# Patient Record
Sex: Male | Born: 1970 | Race: White | Hispanic: Yes | Marital: Married | Smoking: Never smoker
Health system: Southern US, Community
[De-identification: ages and names within clinical notes are randomized; demographics above are authoritative.]

---

## 2018-01-24 ENCOUNTER — Emergency Department (HOSPITAL_BASED_OUTPATIENT_CLINIC_OR_DEPARTMENT_OTHER): Payer: Self-pay

## 2018-01-24 ENCOUNTER — Other Ambulatory Visit: Payer: Self-pay

## 2018-01-24 ENCOUNTER — Emergency Department (HOSPITAL_BASED_OUTPATIENT_CLINIC_OR_DEPARTMENT_OTHER)
Admission: EM | Admit: 2018-01-24 | Discharge: 2018-01-24 | Disposition: A | Discharge status: Home / Self Care | Payer: Self-pay | Attending: Emergency Medicine | Admitting: Emergency Medicine

## 2018-01-24 ENCOUNTER — Encounter (HOSPITAL_BASED_OUTPATIENT_CLINIC_OR_DEPARTMENT_OTHER): Payer: Self-pay | Admitting: Adult Health

## 2018-01-24 DIAGNOSIS — Y9389 Activity, other specified: Secondary | ICD-10-CM | POA: Insufficient documentation

## 2018-01-24 DIAGNOSIS — Y999 Unspecified external cause status: Secondary | ICD-10-CM | POA: Insufficient documentation

## 2018-01-24 DIAGNOSIS — Y929 Unspecified place or not applicable: Secondary | ICD-10-CM | POA: Insufficient documentation

## 2018-01-24 DIAGNOSIS — S62635B Displaced fracture of distal phalanx of left ring finger, initial encounter for open fracture: Secondary | ICD-10-CM

## 2018-01-24 DIAGNOSIS — W278XXA Contact with other nonpowered hand tool, initial encounter: Secondary | ICD-10-CM | POA: Insufficient documentation

## 2018-01-24 MED ORDER — LIDOCAINE HCL (PF) 1 % IJ SOLN
30.0000 mL | Freq: Once | INTRAMUSCULAR | Status: AC
  Administered 2018-01-24: 30 mL

## 2018-01-24 MED ORDER — BUPIVACAINE HCL (PF) 0.5 % IJ SOLN
5.0000 mL | Freq: Once | INTRAMUSCULAR | Status: AC
  Administered 2018-01-24: 5 mL
  Filled 2018-01-24: qty 10

## 2018-01-24 MED ORDER — LIDOCAINE HCL (PF) 1 % IJ SOLN
5.0000 mL | Freq: Once | INTRAMUSCULAR | Status: AC
  Administered 2018-01-24: 5 mL
  Filled 2018-01-24: qty 5

## 2018-01-24 MED ORDER — CEFAZOLIN SODIUM-DEXTROSE 1-4 GM/50ML-% IV SOLN
INTRAVENOUS | Status: AC
  Filled 2018-01-24: qty 100

## 2018-01-24 MED ORDER — CEFAZOLIN SODIUM-DEXTROSE 2-4 GM/100ML-% IV SOLN
2.0000 g | Freq: Once | INTRAVENOUS | Status: AC
  Administered 2018-01-24: 2 g via INTRAVENOUS
  Filled 2018-01-24: qty 100

## 2018-01-24 MED ORDER — FENTANYL CITRATE (PF) 100 MCG/2ML IJ SOLN
50.0000 ug | Freq: Once | INTRAMUSCULAR | Status: AC
  Administered 2018-01-24: 50 ug via INTRAVENOUS
  Filled 2018-01-24: qty 2

## 2018-01-24 NOTE — ED Triage Notes (Addendum)
PResents with injury to left  ring finger. Pt smashed it with a hammer while doing wood work. Tip of finger is maimed and has nail and nail bed involvement

## 2018-01-24 NOTE — ED Provider Notes (Addendum)
[facility] EMERGENCY DEPARTMENT Provider Note   CSN: [id number] Arrival date & time: 01/24/18  1127    History   Chief Complaint Chief Complaint  Patient presents with  . Finger Injury    HPI Robert Day is a 47 y.o. male without significant past medical hx who presents to the ED with L 4th finger injury which occurred 1 hour PTA.  Patient states that he accidentally smashed the distal left fourth finger with a hammer resulting in an open wound.  He states he is having pain to this location which is a 9 out of 10 in severity, no specific alleviating or aggravating factors.  No intervention prior to arrival.  No other areas of injury noted.  Patient is right-hand dominant.  His last tetanus was within the past 5 years.  Denies numbness, tingling, or weakness. Last PO intake was last evening.   HPI  History reviewed. No pertinent past medical history.  There are no active problems to display for this patient.   History reviewed. No pertinent surgical history.      Home Medications    Prior to Admission medications   Not on File    Family History History reviewed. No pertinent family history.  Social History Social History   Tobacco Use  . Smoking status: Never Smoker  Substance Use Topics  . Alcohol use: Never    Frequency: Never  . Drug use: Never     Allergies   Patient has no known allergies.   Review of Systems Review of Systems  Constitutional: Negative for chills and fever.  Musculoskeletal: Positive for arthralgias.  Skin: Positive for wound.  Neurological: Negative for weakness and numbness.  All other systems reviewed and are negative.    Physical Exam Updated Vital Signs BP (!) 174/112 (BP Location: Right Arm) Comment: checked twice  Pulse 76   Temp 98.3 F (36.8 C) (Oral)   Resp 18   Ht 5' 6.5" (1.689 m)   Wt 81.6 kg   SpO2 99%   BMI 28.62 kg/m   Physical Exam  Constitutional: He appears well-developed and  well-nourished. No distress.  HENT:  Head: Normocephalic and atraumatic.  Eyes: Conjunctivae are normal. Right eye exhibits no discharge. Left eye exhibits no discharge.  Cardiovascular:  2+ symmetric radial pulses.  Musculoskeletal:  Upper extremities: With complex laceration to the distal left fourth digit which occurs both ventrally and dorsally, appears to likely extend somewhat into the nailbed.  PIctured below.  Patient has full active range of motion of joints of the upper extremities including all MCPs and IP joints.  Tender to palpation over the left DIP and distal phalanx.  No other areas of tenderness to the upper extremities.  No snuffbox tenderness.  Neurological: He is alert.  Clear speech. Sensation grossly intact to bilateral upper extremities. 5/5 symmetric grip strength.   Psychiatric: He has a normal mood and affect. His behavior is normal. Thought content normal.  Nursing note and vitals reviewed.            ED Treatments / Results  Labs (all labs ordered are listed, but only abnormal results are displayed) Labs Reviewed - No data to display  EKG None  Radiology Dg Finger Ring Left  Result Date: 01/24/2018 CLINICAL DATA:  Hit finger with hammer. EXAM: LEFT RING FINGER 2+V COMPARISON:  None. FINDINGS: There is a fracture through the tip of the left ring finger distal phalanx. Fracture fragments are mildly displaced. Overlying soft tissue defect  and swelling. No subluxation or dislocation. IMPRESSION: Displaced fracture fragments through the tip of the left ring finger distal phalanx. Electronically Signed   By: Charlett Nose M.D.   On: 01/24/2018 11:59    Procedures .Nerve Block Date/Time: 01/24/2018 1:47 PM Performed by: Cherly Anderson, PA-C Authorized by: Cherly Anderson, PA-C   Consent:    Consent obtained:  Verbal   Consent given by:  Patient   Risks discussed:  Allergic reaction, bleeding, infection, intravenous injection, nerve  damage, pain and unsuccessful block   Alternatives discussed:  No treatment Indications:    Indications:  Pain relief and procedural anesthesia Location:    Body area:  Upper extremity   Laterality:  Left (4th finger digital block) Pre-procedure details:    Skin preparation:  Alcohol Procedure details (see MAR for exact dosages):    Block needle gauge:  27 G   Anesthetic injected:  Lidocaine 1% w/o epi and bupivacaine 0.5% w/o epi   Injection procedure:  Anatomic landmarks identified Post-procedure details:    Outcome:  Anesthesia achieved   Patient tolerance of procedure:  Tolerated well, no immediate complications   (including critical care time)  Medications Ordered in ED Medications  ceFAZolin (ANCEF) 1-4 GM/50ML-% IVPB (has no administration in time range)  bupivacaine (MARCAINE) 0.5 % injection 5 mL (5 mLs Infiltration Given by Other 01/24/18 1238)  lidocaine (PF) (XYLOCAINE) 1 % injection 5 mL (5 mLs Infiltration Given by Other 01/24/18 1238)  ceFAZolin (ANCEF) IVPB 2g/100 mL premix ( Intravenous Stopped 01/24/18 1328)  fentaNYL (SUBLIMAZE) injection 50 mcg (50 mcg Intravenous Given 01/24/18 1246)     Initial Impression / Assessment and Plan / ED Course  I have reviewed the triage vital signs and the nursing notes.  Pertinent labs & imaging results that were available during my care of the patient were reviewed by me and considered in my medical decision making (see chart for details).   Patient presents to the emergency department with left fourth finger injury.  Patient appears to have open fracture with displaced fracture fragments of the tip of the distal phalanx as well as fairly complex laceration as pictured above.  Last tetanus was within past 5 years. Given 2 g of Ancef IV as well as fentanyl for pain. Digital block performed with subsequent pressure irrigation with 1L of sterile water. Will plan for hand surgery consultation.   14:00: CONSULT: Discussed case with Dr.  Carlos Levering team- will transfer to [facility] ER via POV for his team to evaluate. Patient's wife is able to take him there.   14:08: CONSULT: Discussed case with ED physician Dr. Jacqulyn Bath- accepts patient for ER to ER transfer.   I discussed results and plan for transfer with patient. Provided opportunity for questions, patient confirmed understanding and is in agreement with plan.   Findings and plan of care discussed with supervising physician Dr. Fredderick Phenix- in agreement.    Final Clinical Impressions(s) / ED Diagnoses   Final diagnoses:  Displaced fracture of distal phalanx of left ring finger, initial encounter for open fracture    ED Discharge Orders    None       Cherly Anderson, PA-C 01/24/18 1540    [id number] [place], PA-C 01/24/18 1542    Rolan Bucco, MD 01/25/18 [phone]

## 2018-01-24 NOTE — Consult Note (Signed)
Reason for Consult:for evaluation treatment of his left ring finger after crushing injury with open fracture. He was transferred from [facility]. Referring Physician: [facility] emergency room staff  Robert Day is an 47 y.o. male.  HPI: 47 year oldmale crush injury left ring finger with open fracture. He has nail plate injury nailbed and disarray the soft tissue.  He denies other injury. He is here for surgical management. He denies neck back chest or abdominal pain.  History reviewed. No pertinent past medical history.  History reviewed. No pertinent surgical history.  History reviewed. No pertinent family history.  Social History:  reports that he has never smoked. He does not have any smokeless tobacco history on file. He reports that he does not drink alcohol or use drugs.  Allergies: No Known Allergies  Medications: I have reviewed the patient's current medications.  No results found for this or any previous visit (from the past 48 hour(s)).  Dg Finger Ring Left  Result Date: 01/24/2018 CLINICAL DATA:  Hit finger with hammer. EXAM: LEFT RING FINGER 2+V COMPARISON:  None. FINDINGS: There is a fracture through the tip of the left ring finger distal phalanx. Fracture fragments are mildly displaced. Overlying soft tissue defect and swelling. No subluxation or dislocation. IMPRESSION: Displaced fracture fragments through the tip of the left ring finger distal phalanx. Electronically Signed   By: Charlett Nose M.D.   On: 01/24/2018 11:59    Review of Systems  HENT: Negative.   Respiratory: Negative.   Cardiovascular: Negative.   Gastrointestinal: Negative.   Genitourinary: Negative.   Neurological: Negative.   Endo/Heme/Allergies: Negative.   Psychiatric/Behavioral: Negative.    Blood pressure (!) 148/103, pulse 65, temperature 98.4 F (36.9 C), temperature source Oral, resp. rate 17, height 5' 6.5" (1.689 m), weight 81.6 kg, SpO2 99 %. Physical  Exam Left ring finger crush injury open fracture nail bed nail plate disarray. No evidence of instability or infection no evidence of asked her compromise.  X-rays do reveal the distal phalanx fracture comminuted in nature. There is large amount of skin avulsion.  The patient is alert and oriented in no acute distress. The patient complains of pain in the affected upper extremity.  The patient is noted to have a normal HEENT exam. Lung fields show equal chest expansion and no shortness of breath. Abdomen exam is nontender without distention. Lower extremity examination does not show any fracture dislocation or blood clot symptoms. Pelvis is stable and the neck and back are stable and nontender. Assessment/Plan: Left ring finger crushing injury. I've consented him for I and D and repair is necessary. In stance risk and benefits and desires to proceed.   01/24/2018  4:07 PM  PATIENT:  Robert Day    PRE-OPERATIVE DIAGNOSIS:    POST-OPERATIVE DIAGNOSIS:  Same  PROCEDURE:    SURGEON:  Karen Chafe, MD  PHYSICIAN ASSISTANT:   ANESTHESIA:     PREOPERATIVE INDICATIONS:  Robert Day is a  47 y.o. male with a diagnosis of   The risks benefits and alternatives were discussed with the patient preoperatively including but not limited to the risks of infection, bleeding, nerve injury, cardiopulmonary complications, the need for revision surgery, among others, and the patient was willing to proceed.   OPERATIVE PROCEDURE: Patient was seen sterilely prepped and draped in the sterile fashion after a intermetacarpal block was performed with lidocaine without epinephrine. Patient then had nail plate removed with Camelia Eng. Following this the  patient went irrigation debridement and open fracture about the distal phalanx. This was an excisional debridement of a open fracture performed with curette knife blade and scissor. Following this patient underwent setting  technique of the distal phalanx with open treatment of distal phalanx fracture. Following this the patient underwent meticulous repair of the nail bed with 4-0 loupe magnification and 6-0 chromic suture for the complex repair. Next the lateral nail fold was repaired. Following this Adaptic was placed under the eponychial fold to prevent nailbed adherent and the patient was dressed sterilely.  Thus the patient underwent irrigation and debridement of an open fracture, nail plate removal, nailbed repair, open treatment of distal phalanx fracture.  Wounds look good conclusion of the procedure. There were no comp cutting features. Adaptic under the eponychial fold. I'll see him Thursday for dressing change. Discharge medicines Bactrim DS 14 days and Norco when necessary pain with precautions.   We recommend that you to take vitamin C 1000 mg a day to promote healing. We also recommend that if you require  pain medicine that you take a stool softener to prevent constipation as most pain medicines will have constipation side effects. We recommend either Peri-Colace or Senokot and recommend that you also consider adding MiraLAX as well to prevent the constipation affects from pain medicine if you are required to use them. These medicines are over the counter and may be purchased at a local pharmacy. A cup of yogurt and a probiotic can also be helpful during the recovery process as the medicines can disrupt your intestinal environment. Keep bandage clean and dry.  Call for any problems.  No smoking.  Criteria for driving a car: you should be off your pain medicine for 7-8 hours, able to drive one handed(confident), thinking clearly and feeling able in your judgement to drive. Continue elevation as it will decrease swelling.  If instructed by MD move your fingers within the confines of the bandage/splint.  Use ice if instructed by your MD. Call immediately for any sudden loss of feeling in your hand/arm or change  in functional abilities of the extremity. Robert Day Robert Day 01/24/2018, 4:05 PM

## 2018-01-24 NOTE — Discharge Instructions (Addendum)
Please see S Thursday as Dr. Amanda Pea discussed with the period  We recommend that you to take vitamin C 1000 mg a day to promote healing. We also recommend that if you require  pain medicine that you take a stool softener to prevent constipation as most pain medicines will have constipation side effects. We recommend either Peri-Colace or Senokot and recommend that you also consider adding MiraLAX as well to prevent the constipation affects from pain medicine if you are required to use them. These medicines are over the counter and may be purchased at a local pharmacy. A cup of yogurt and a probiotic can also be helpful during the recovery process as the medicines can disrupt your intestinal environment.   Keep bandage clean and dry.  Call for any problems.  No smoking.  Criteria for driving a car: you should be off your pain medicine for 7-8 hours, able to drive one handed(confident), thinking clearly and feeling able in your judgement to drive. Continue elevation as it will decrease swelling.  If instructed by MD move your fingers within the confines of the bandage/splint.  Use ice if instructed by your MD. Call immediately for any sudden loss of feeling in your hand/arm or change in functional abilities of the extremity.

## 2018-01-24 NOTE — ED Notes (Signed)
Pt verbalized understanding that he is not eat, drink or use IV for any purpose while en route to [facility] ED.

## 2018-01-24 NOTE — ED Triage Notes (Signed)
Per EDP Zavitz have Pt follow up with PCP. Give Pt the referral # to find a PCP

## 2018-01-24 NOTE — ED Triage Notes (Signed)
Pt arrived with IV intact to right arm from [facility].  Pt has a wrapped left hand with ring finger injury.  Pain free and Dr. Amanda Pea has seen patient.

## 2018-01-24 NOTE — ED Notes (Signed)
Report to Asher Muir, charge RN [facility] ED

## 2018-01-24 NOTE — ED Notes (Signed)
ED Provider at bedside. 

## 2018-01-24 NOTE — ED Notes (Signed)
Patient transported to X-ray 

## 2020-05-20 IMAGING — CR DG FINGER RING 2+V*L*
3 series · 3 of 3 positions shown · non-contrast
Comparison: None.

CLINICAL DATA: Hit finger with hammer.

EXAM:
LEFT RING FINGER 2+V

[x finger pa left]
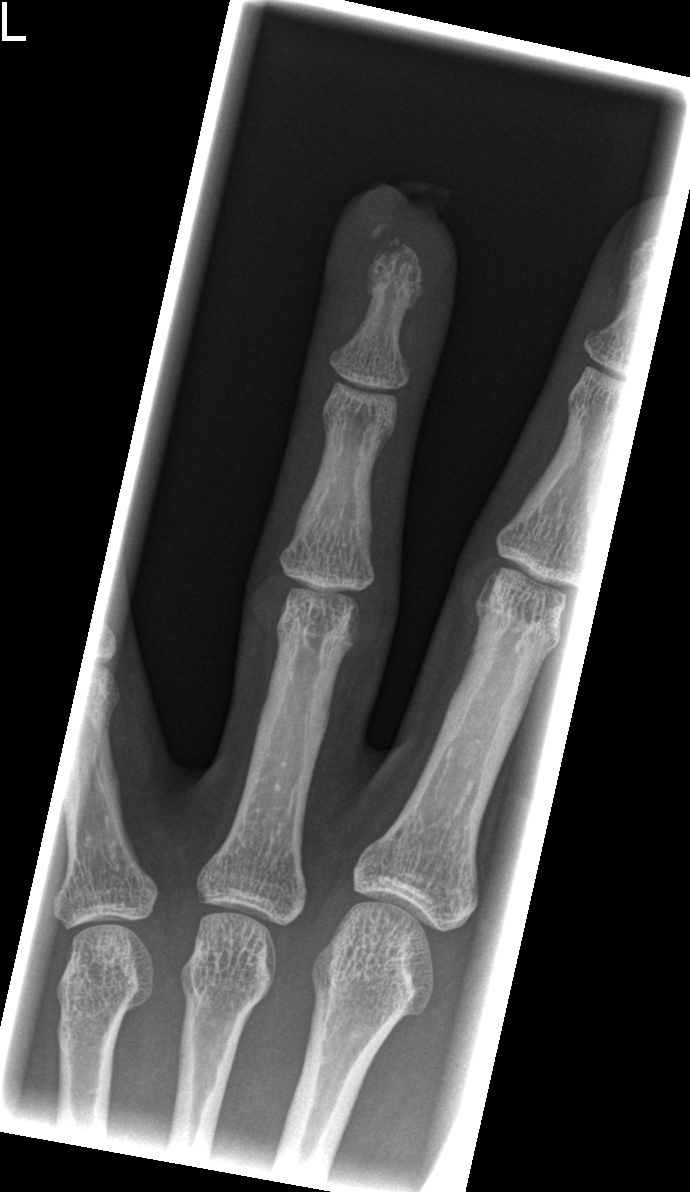

[x finger obl. left]
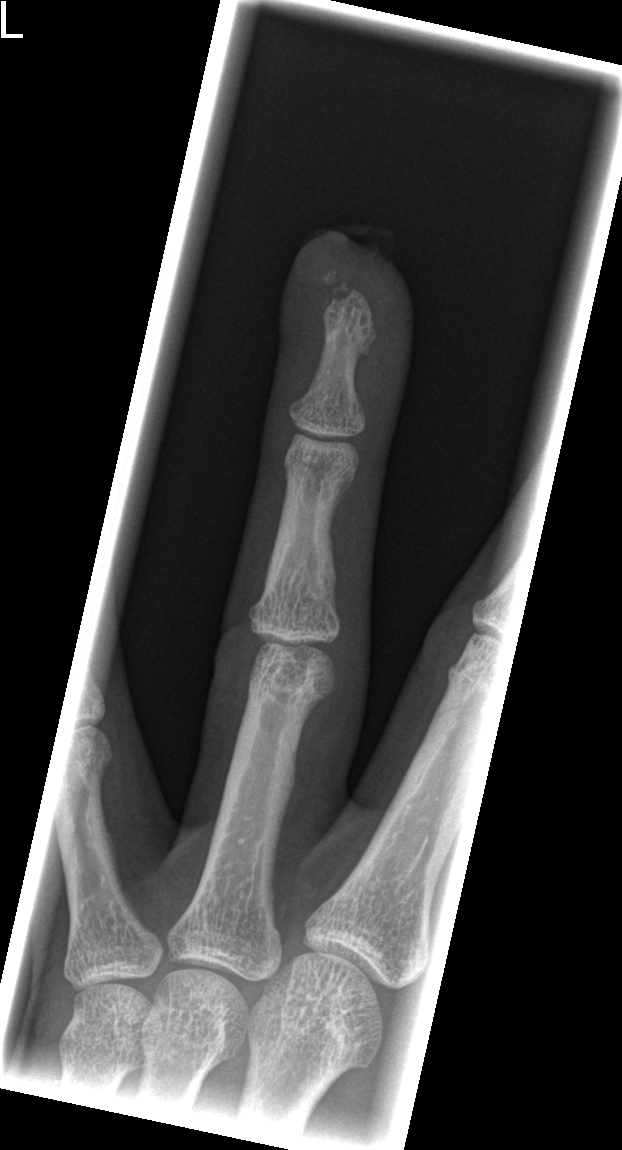

[x finger lateral left]
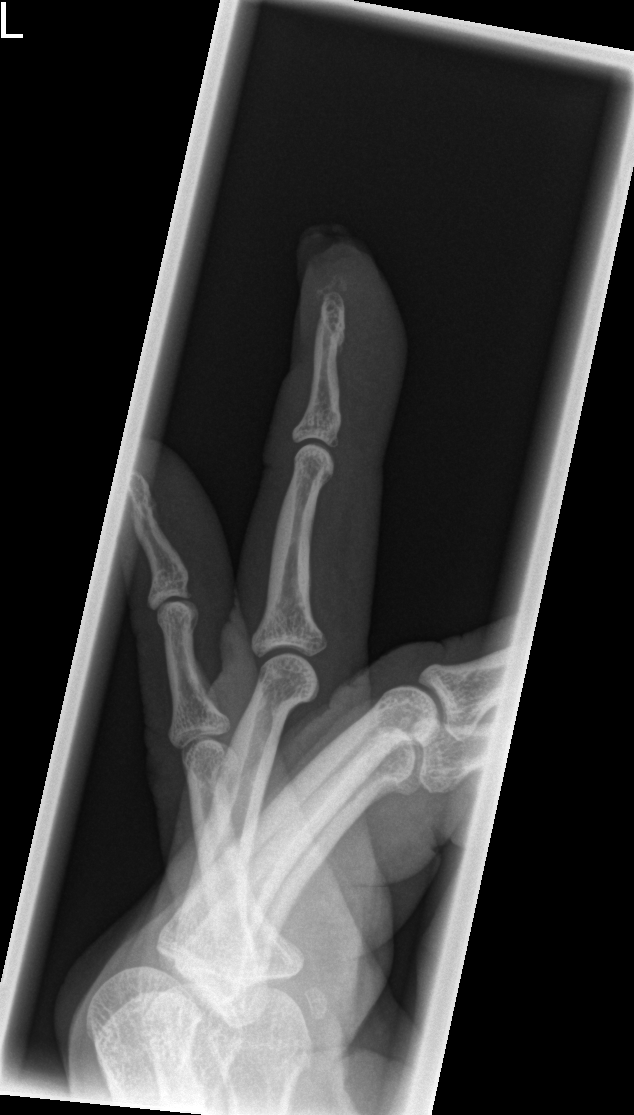

[3 of 3 positions shown; findings below may reference images not displayed]

FINDINGS: There is a fracture through the tip of the left ring finger distal
phalanx. Fracture fragments are mildly displaced. Overlying soft
tissue defect and swelling. No subluxation or dislocation.
IMPRESSION: Displaced fracture fragments through the tip of the left ring finger
distal phalanx.
